# Patient Record
Sex: Female | Born: 2000 | Race: White | Hispanic: No | Marital: Single | Smoking: Current every day smoker
Health system: Southern US, Community
[De-identification: ages and names within clinical notes are randomized; demographics above are authoritative.]

## PROBLEM LIST (undated history)

## (undated) DIAGNOSIS — Z9109 Other allergy status, other than to drugs and biological substances: Secondary | ICD-10-CM

## (undated) DIAGNOSIS — K589 Irritable bowel syndrome without diarrhea: Secondary | ICD-10-CM

## (undated) DIAGNOSIS — F32A Depression, unspecified: Secondary | ICD-10-CM

## (undated) HISTORY — PX: WISDOM TOOTH EXTRACTION: SHX21

## (undated) HISTORY — DX: Other allergy status, other than to drugs and biological substances: Z91.09

## (undated) HISTORY — DX: Depression, unspecified: F32.A

## (undated) HISTORY — DX: Irritable bowel syndrome, unspecified: K58.9

---
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
## 2019-08-14 IMAGING — CT CT HEAD W/O CM
3 series · 15 of 47 positions shown, 18 images · non-contrast
Comparison: 02/24/2015

CLINICAL DATA: Syncope

EXAM:
CT HEAD WITHOUT CONTRAST
TECHNIQUE: Contiguous axial images were obtained from the base of the skull
through the vertex without intravenous contrast.

[Series 2: head trauma wo · axial · 0.43mm/px · z∈[+1560,+1685]mm · 9 of 31 slices shown, 12 images]
[im 3/31  brain]
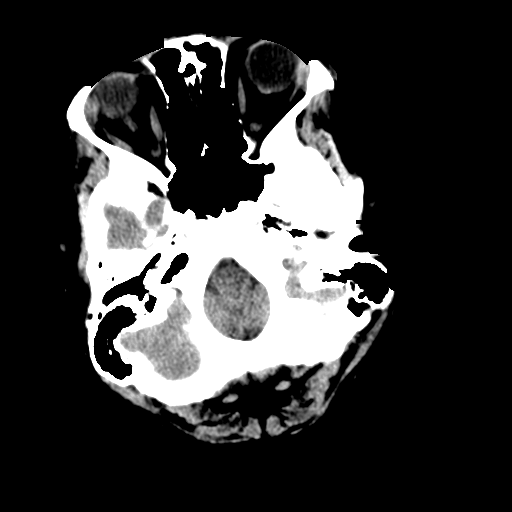
[im 3/31  bone]
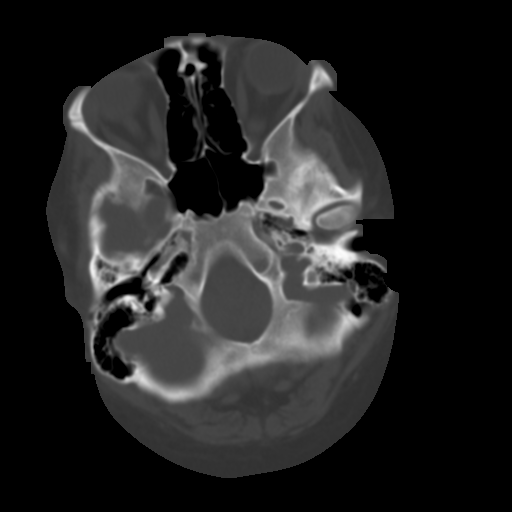
[im 6/31  brain]
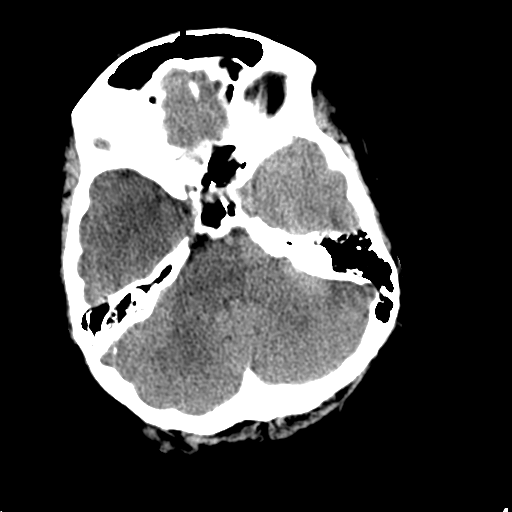
[im 9/31  brain]
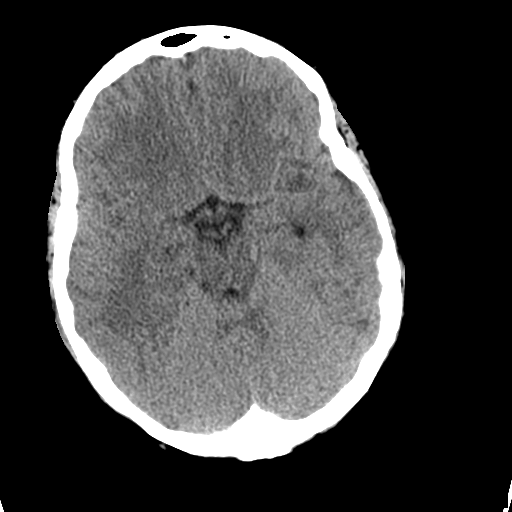
[im 12/31  brain]
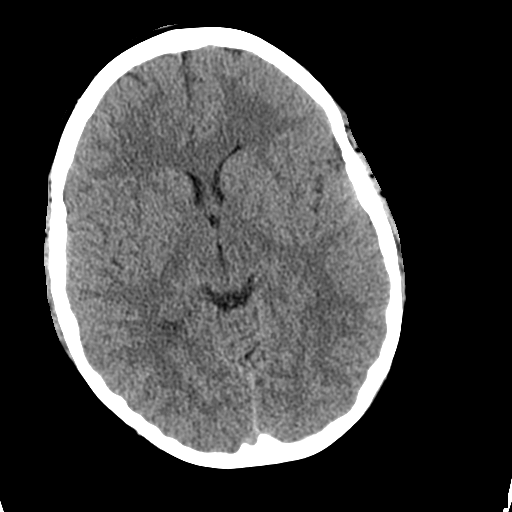
[im 16/31  brain]
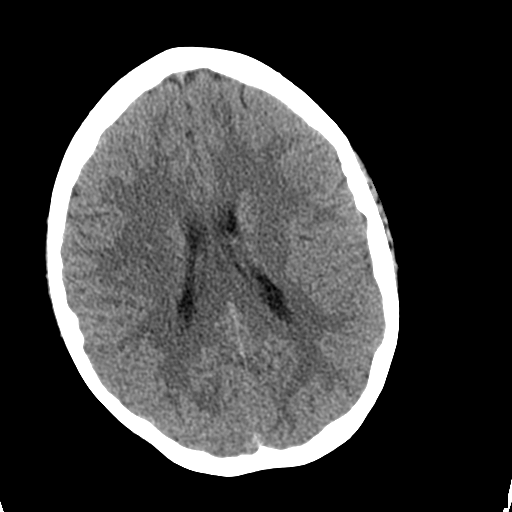
[im 16/31  bone]
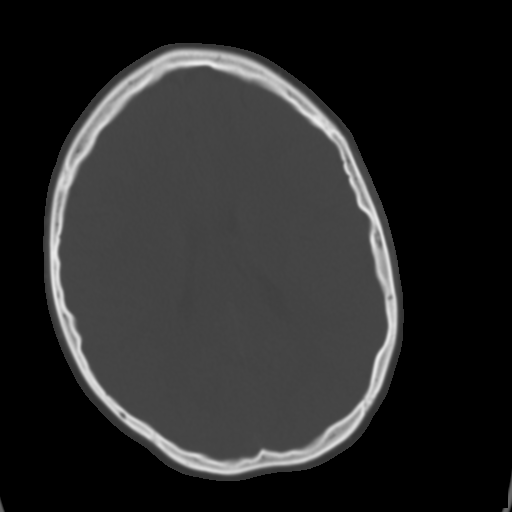
[im 19/31  brain]
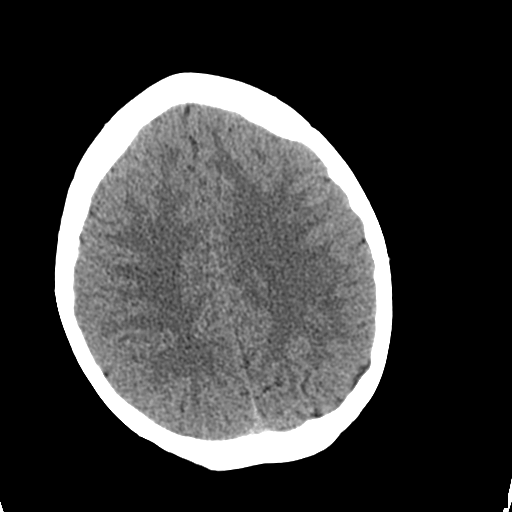
[im 22/31  brain]
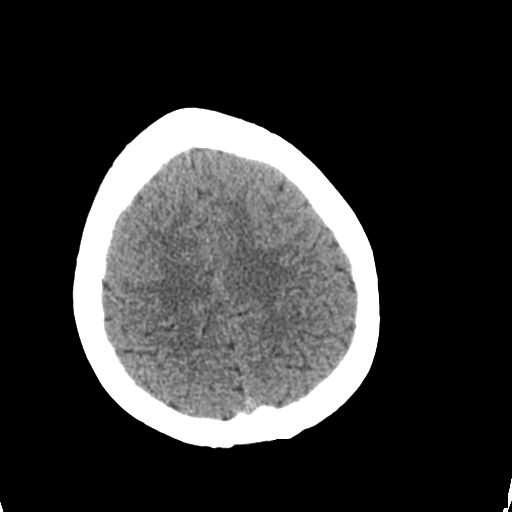
[im 25/31  brain]
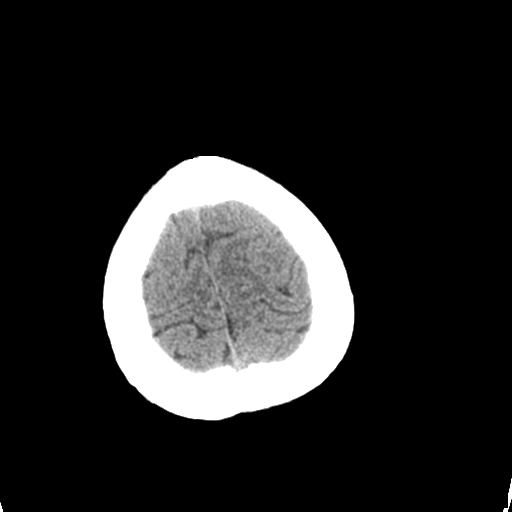
[im 28/31  brain]
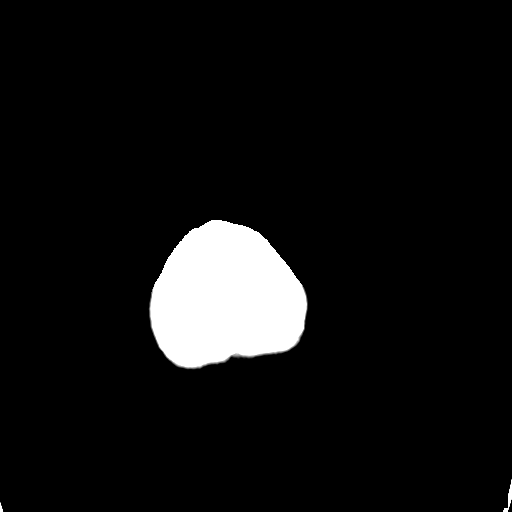
[im 28/31  bone]
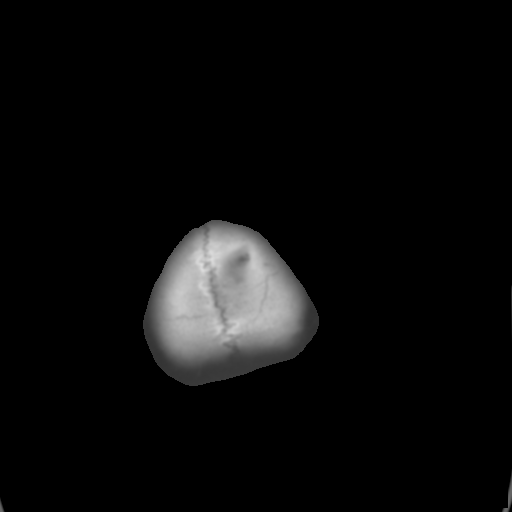

[Series 4: coronal soft tissue · coronal · 0.31mm/px · 3 of 72 slices shown]
[im 24/72  brain]
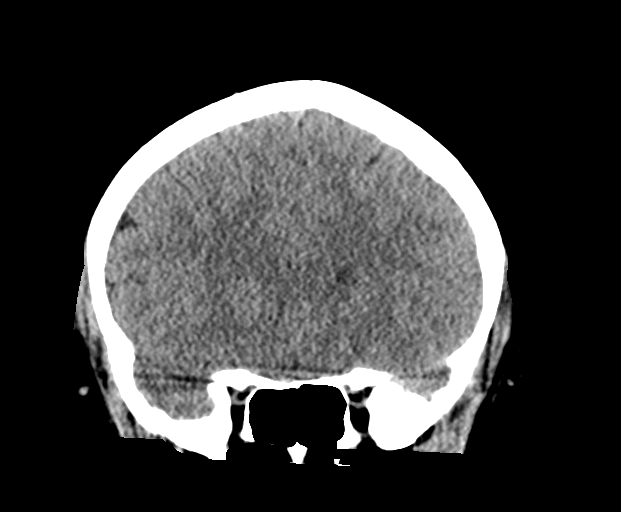
[im 32/72  brain]
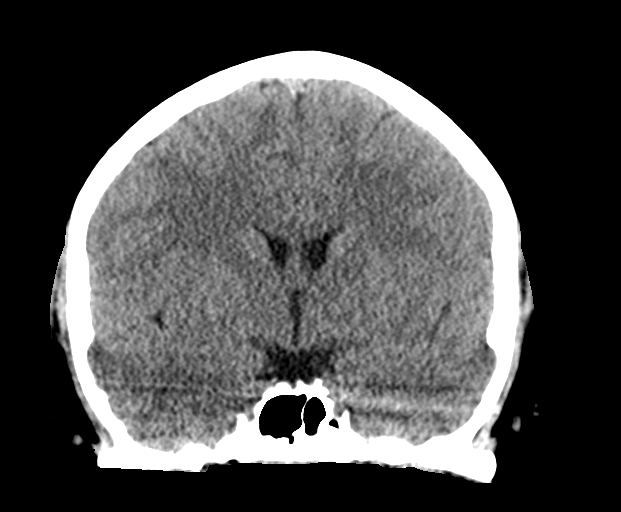
[im 40/72  brain]
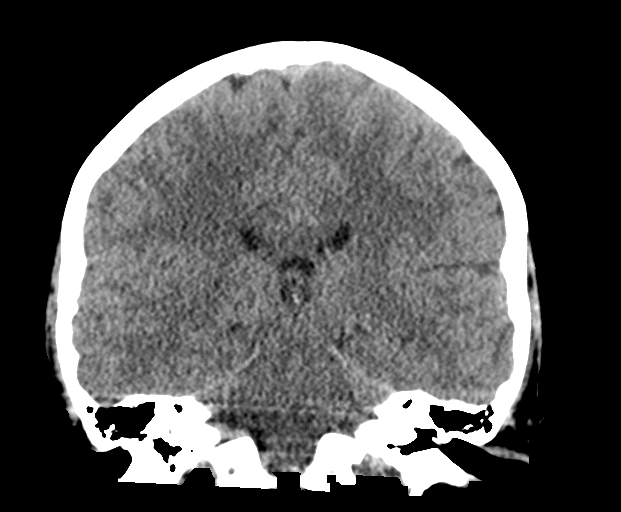

[Series 5: sagittal soft tissue · sagittal · 0.34mm/px · 3 of 58 slices shown]
[im 20/58  brain]
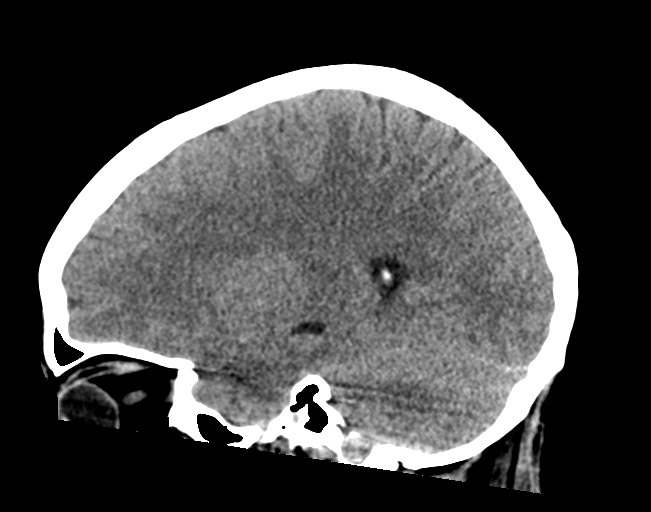
[im 29/58  brain]
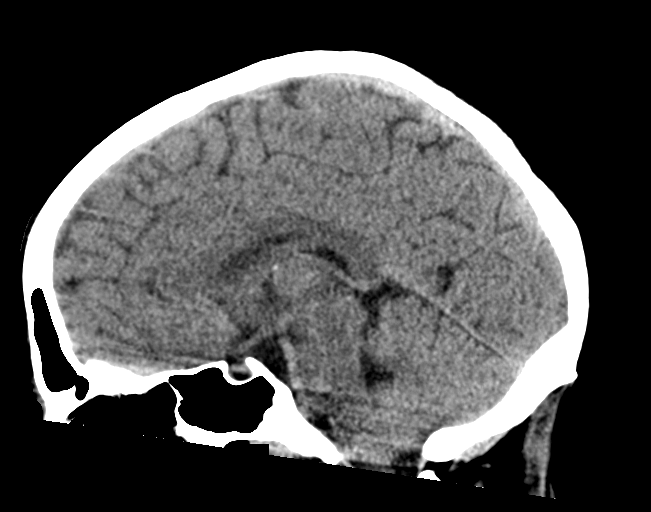
[im 39/58  brain]
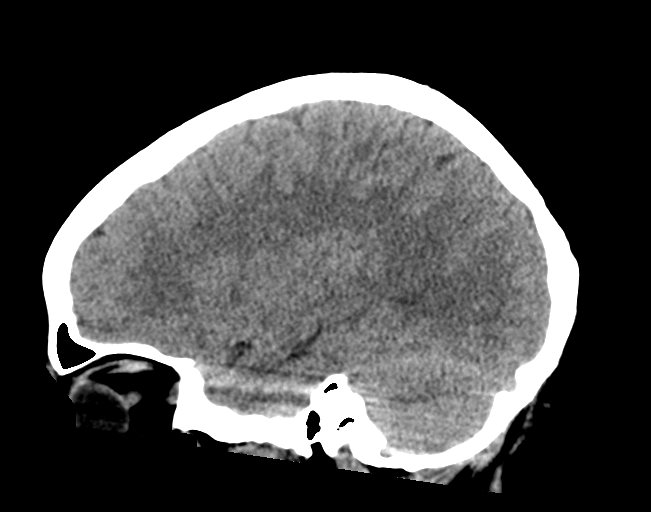

[15 of 47 positions shown; findings below may reference images not displayed]

FINDINGS: Brain: There is no mass, hemorrhage or extra-axial collection. The
size and configuration of the ventricles and extra-axial CSF spaces
are normal. There is no acute or chronic infarction. The brain
parenchyma is normal.

Vascular: No abnormal hyperdensity of the major intracranial
arteries or dural venous sinuses. No intracranial atherosclerosis.

Skull: The visualized skull base, calvarium and extracranial soft
tissues are normal.

Sinuses/Orbits: No fluid levels or advanced mucosal thickening of
the visualized paranasal sinuses. No mastoid or middle ear effusion.
The orbits are normal.
IMPRESSION: Normal head CT.

## 2021-12-08 ENCOUNTER — Other Ambulatory Visit: Payer: Self-pay | Admitting: Internal Medicine

## 2021-12-08 ENCOUNTER — Other Ambulatory Visit (HOSPITAL_COMMUNITY): Payer: Self-pay | Admitting: Internal Medicine

## 2021-12-08 DIAGNOSIS — R1011 Right upper quadrant pain: Secondary | ICD-10-CM

## 2021-12-17 ENCOUNTER — Ambulatory Visit (HOSPITAL_COMMUNITY): Payer: Medicaid Other

## 2021-12-17 ENCOUNTER — Encounter (HOSPITAL_COMMUNITY): Payer: Self-pay

## 2021-12-23 ENCOUNTER — Ambulatory Visit (HOSPITAL_COMMUNITY)
Admission: RE | Admit: 2021-12-23 | Discharge: 2021-12-23 | Disposition: A | Discharge status: Home / Self Care | Payer: Medicaid Other | Source: Ambulatory Visit | Attending: Internal Medicine | Admitting: Internal Medicine

## 2021-12-23 DIAGNOSIS — R1011 Right upper quadrant pain: Secondary | ICD-10-CM | POA: Insufficient documentation

## 2021-12-23 MED ORDER — IOHEXOL 300 MG/ML  SOLN
100.0000 mL | Freq: Once | INTRAMUSCULAR | Status: AC | PRN
  Administered 2021-12-23: 75 mL via INTRAVENOUS

## 2023-01-27 ENCOUNTER — Ambulatory Visit (INDEPENDENT_AMBULATORY_CARE_PROVIDER_SITE_OTHER): Payer: Medicaid Other | Admitting: Advanced Practice Midwife

## 2023-01-27 ENCOUNTER — Encounter: Payer: Self-pay | Admitting: Advanced Practice Midwife

## 2023-01-27 ENCOUNTER — Other Ambulatory Visit (HOSPITAL_COMMUNITY)
Admission: RE | Admit: 2023-01-27 | Discharge: 2023-01-27 | Disposition: A | Discharge status: Home / Self Care | Payer: Medicaid Other | Source: Ambulatory Visit | Attending: Advanced Practice Midwife | Admitting: Advanced Practice Midwife

## 2023-01-27 VITALS — BP 106/75 | HR 58 | Ht 66.0 in | Wt 200.0 lb

## 2023-01-27 DIAGNOSIS — Z01419 Encounter for gynecological examination (general) (routine) without abnormal findings: Secondary | ICD-10-CM | POA: Insufficient documentation

## 2023-01-27 NOTE — Addendum Note (Signed)
Addended by: Moss Mc on: 01/27/2023 12:36 PM   Modules accepted: Orders

## 2023-01-27 NOTE — Progress Notes (Signed)
WELL-WOMAN EXAMINATION Patient name: Jamie Graham MRN [id number]  Date of birth: 10/11/2000 Chief Complaint:   Annual Exam and Contraception (Desires Nexplanon)  History of Present Illness:   Jamie Graham is a 22 y.o. G0P0000 Caucasian female being seen today for a routine well-woman exam.  Current complaints: wants to start on Nexplanon per discussion with PCP at [facility] due to heavy menses; used to take OCPs but stopped awhile ago; had sex 1-1.5wks ago  PCP: [facility]      does not desire labs No LMP recorded (lmp unknown). (Menstrual status: Irregular Periods). The current method of family planning is condoms.  Last pap never. Results were: N/A. H/O abnormal pap: no Last mammogram: never. Results were: N/A. Family h/o breast cancer: no Last colonoscopy: never. Results were: N/A. Family h/o colorectal cancer: no     01/27/2023    9:12 AM  Depression screen PHQ 2/9  Decreased Interest 0  Down, Depressed, Hopeless 0  PHQ - 2 Score 0  Altered sleeping 0  Tired, decreased energy 0  Change in appetite 0  Feeling bad or failure about yourself  0  Trouble concentrating 0  Moving slowly or fidgety/restless 0  Suicidal thoughts 0  PHQ-9 Score 0        01/27/2023    9:12 AM  GAD 7 : Generalized Anxiety Score  Nervous, Anxious, on Edge 0  Control/stop worrying 0  Worry too much - different things 0  Trouble relaxing 0  Restless 0  Easily annoyed or irritable 0  Afraid - awful might happen 0  Total GAD 7 Score 0     Review of Systems:   Pertinent items are noted in HPI Denies any headaches, blurred vision, fatigue, shortness of breath, chest pain, abdominal pain, abnormal vaginal discharge/itching/odor/irritation, problems with periods, bowel movements, urination, or intercourse unless otherwise stated above. Pertinent History Reviewed:  Reviewed past medical,surgical, social and family history.  Reviewed problem list, medications and  allergies. Physical Assessment:   Vitals:   01/27/23 0857  BP: 106/75  Pulse: (!) 58  Weight: 200 lb (90.7 kg)  Height: 5\' 6"  (1.676 m)  Body mass index is 32.28 kg/m.        Physical Examination:   General appearance - well appearing, and in no distress  Mental status - alert, oriented to person, place, and time  Psych:  She has a normal mood and affect  Skin - warm and dry, normal color, no suspicious lesions noted  Chest - effort normal, all lung fields clear to auscultation bilaterally  Heart - normal rate and regular rhythm  Neck:  midline trachea, no thyromegaly or nodules  Breasts - breasts appear normal, no suspicious masses, no skin or nipple changes or  axillary nodes  Abdomen - soft, nontender, nondistended, no masses or organomegaly  Pelvic - VULVA: normal appearing vulva with no masses, tenderness or lesions  VAGINA: normal appearing vagina with normal color and discharge, no lesions  CERVIX: normal appearing cervix without discharge or lesions, no CMT  Thin prep pap is done without HR HPV cotesting  UTERUS: uterus is felt to be normal size, shape, consistency and nontender   ADNEXA: No adnexal masses or tenderness noted.  Rectal - not examined    Chaperone: Latisha Cresenzo    No results found for this or any previous visit (from the past 24 hour(s)).  Assessment & Plan:  1) Well-Woman Exam  2) Wants Nexplanon> understands R&Bs including  risk of irreg bldg; will sched for next week- abstinence until then  3) Major depression/ADD> Trazodone & Vraylar  Labs/procedures today: Pap  Mammogram: @ 23yo, or sooner if problems Colonoscopy: @ 23yo, or sooner if problems  No orders of the defined types were placed in this encounter.   Meds: No orders of the defined types were placed in this encounter.   Follow-up: Return for Nex insertion next Wed or Thurs.  Arabella Merles CNM 01/27/2023 9:32 AM

## 2023-02-01 LAB — CYTOLOGY - PAP
Chlamydia: NEGATIVE
Comment: NEGATIVE
Comment: NEGATIVE
Comment: NORMAL
Diagnosis: UNDETERMINED — AB
High risk HPV: NEGATIVE
Neisseria Gonorrhea: NEGATIVE

## 2023-02-03 ENCOUNTER — Ambulatory Visit: Payer: Medicaid Other | Admitting: Advanced Practice Midwife

## 2023-02-03 ENCOUNTER — Encounter: Payer: Self-pay | Admitting: Advanced Practice Midwife

## 2023-02-03 VITALS — BP 101/66 | HR 69 | Ht 67.0 in | Wt 201.0 lb

## 2023-02-03 DIAGNOSIS — Z30017 Encounter for initial prescription of implantable subdermal contraceptive: Secondary | ICD-10-CM | POA: Insufficient documentation

## 2023-02-03 DIAGNOSIS — Z3202 Encounter for pregnancy test, result negative: Secondary | ICD-10-CM | POA: Diagnosis not present

## 2023-02-03 LAB — POCT URINE PREGNANCY: Preg Test, Ur: NEGATIVE

## 2023-02-03 MED ORDER — ETONOGESTREL 68 MG ~~LOC~~ IMPL
68.0000 mg | DRUG_IMPLANT | Freq: Once | SUBCUTANEOUS | Status: AC
Start: 2023-02-03 — End: 2023-02-03
  Administered 2023-02-03: 68 mg via SUBCUTANEOUS

## 2023-02-03 NOTE — Progress Notes (Addendum)
NEXPLANON INSERTION Patient name: Jamie Graham MRN [id number]  Date of birth: 2000-08-08 Subjective Findings:   Jamie Graham is a 22 y.o. G0P0000 Caucasian female being seen today for insertion of a Nexplanon.  No LMP recorded (lmp unknown). (Menstrual status: Irregular Periods). Last sexual intercourse was >2wks ago Last pap 01/27/23. Results were: ASCUS w/ HRHPV negative  Risks/benefits/side effects of Nexplanon have been discussed and her questions have been answered.  Specifically, a failure rate of 05/998 has been reported, with an increased failure rate if pt takes St. John's Wort and/or antiseizure medicaitons.  She is aware of the common side effect of irregular bleeding, which the incidence of decreases over time. Signed copy of informed consent in chart.      01/27/2023    9:12 AM  Depression screen PHQ 2/9  Decreased Interest 0  Down, Depressed, Hopeless 0  PHQ - 2 Score 0  Altered sleeping 0  Tired, decreased energy 0  Change in appetite 0  Feeling bad or failure about yourself  0  Trouble concentrating 0  Moving slowly or fidgety/restless 0  Suicidal thoughts 0  PHQ-9 Score 0        01/27/2023    9:12 AM  GAD 7 : Generalized Anxiety Score  Nervous, Anxious, on Edge 0  Control/stop worrying 0  Worry too much - different things 0  Trouble relaxing 0  Restless 0  Easily annoyed or irritable 0  Afraid - awful might happen 0  Total GAD 7 Score 0     Pertinent History Reviewed:   Reviewed past medical,surgical, social, obstetrical and family history.  Reviewed problem list, medications and allergies. Objective Findings & Procedure:    Vitals:   02/03/23 1140  BP: 101/66  Pulse: 69  Weight: 201 lb (91.2 kg)  Height: 5\' 7"  (1.702 m)  Body mass index is 31.48 kg/m.  Results for orders placed or performed in visit on 02/03/23 (from the past 24 hour(s))  POCT urine pregnancy   Collection Time: 02/03/23 11:45 AM  Result Value Ref Range   Preg  Test, Ur Negative Negative     Time out was performed.  She is right-handed, so her left arm, approximately 10cm from the medial epicondyle and 3-5cm posterior to the sulcus, was cleansed with alcohol and anesthetized with 2cc of 2% Lidocaine.  The area was cleansed again with betadine and the Nexplanon was inserted per manufacturer's recommendations without difficulty.  3 steri-strips and pressure bandage were applied. The patient tolerated the procedure well.  Assessment & Plan:   1) Nexplanon insertion Pt was instructed to keep the area clean and dry, remove pressure bandage in 24 hours, and keep insertion site covered with the steri-strip for 3-5 days.  Back up contraception was recommended for 2 weeks.  She was given a card indicating date Nexplanon was inserted and date it needs to be removed. Follow-up PRN problems.  EXP 2026-04 LOT [id number]  Orders Placed This Encounter  Procedures   POCT urine pregnancy    Follow-up: Return for prn.  Arabella Merles CNM 02/03/2023 12:33 PM

## 2023-06-09 ENCOUNTER — Encounter: Payer: Self-pay | Admitting: Advanced Practice Midwife

## 2023-06-09 ENCOUNTER — Ambulatory Visit: Payer: 59 | Admitting: Advanced Practice Midwife

## 2023-06-09 VITALS — BP 113/63 | HR 54 | Ht 67.0 in | Wt 192.4 lb

## 2023-06-09 DIAGNOSIS — N921 Excessive and frequent menstruation with irregular cycle: Secondary | ICD-10-CM | POA: Diagnosis not present

## 2023-06-09 DIAGNOSIS — Z975 Presence of (intrauterine) contraceptive device: Secondary | ICD-10-CM | POA: Insufficient documentation

## 2023-06-09 MED ORDER — MEGESTROL ACETATE 40 MG PO TABS: ORAL_TABLET | ORAL | 3 refills | Status: DC

## 2023-06-09 NOTE — Progress Notes (Signed)
   GYN VISIT Patient name: Jamie Graham MRN [id number]  Date of birth: 2001-02-08 Chief Complaint:   Menstrual Problem (Period started 05-07-23,just stop 06-07-23. Has nexplanon)  History of Present Illness:   Jamie Graham is a 23 y.o. G0P0000 Caucasian female being seen today for just finishing a 30d period; used one box of tampons this past month; had Nexplanon placed in September and it otherwise has been going well.     Patient's last menstrual period was 05/07/2023. The current method of family planning is Nexplanon.  Last pap Sept 2024. Results were: NILM w/ HRHPV negative     01/27/2023    9:12 AM  Depression screen PHQ 2/9  Decreased Interest 0  Down, Depressed, Hopeless 0  PHQ - 2 Score 0  Altered sleeping 0  Tired, decreased energy 0  Change in appetite 0  Feeling bad or failure about yourself  0  Trouble concentrating 0  Moving slowly or fidgety/restless 0  Suicidal thoughts 0  PHQ-9 Score 0        01/27/2023    9:12 AM  GAD 7 : Generalized Anxiety Score  Nervous, Anxious, on Edge 0  Control/stop worrying 0  Worry too much - different things 0  Trouble relaxing 0  Restless 0  Easily annoyed or irritable 0  Afraid - awful might happen 0  Total GAD 7 Score 0     Review of Systems:   Pertinent items are noted in HPI Denies fever/chills, dizziness, headaches, visual disturbances, fatigue, shortness of breath, chest pain, abdominal pain, vomiting, abnormal vaginal discharge/itching/odor/irritation, problems with periods, bowel movements, urination, or intercourse unless otherwise stated above.  Pertinent History Reviewed:  Reviewed past medical,surgical, social, obstetrical and family history.  Reviewed problem list, medications and allergies. Physical Assessment:   Vitals:   06/09/23 1342  BP: 113/63  Pulse: (!) 54  Weight: 192 lb 6.4 oz (87.3 kg)  Height: 5\' 7"  (1.702 m)  Body mass index is 30.13 kg/m.       Physical Examination:   General  appearance: alert, well appearing, and in no distress  Mental status: alert, oriented to person, place, and time  Skin: warm & dry   Cardiovascular: normal heart rate noted  Respiratory: normal respiratory effort, no distress  Abdomen: soft, non-tender   Pelvic: examination not indicated  Extremities: no edema    No results found for this or any previous visit (from the past 24 hours).  Assessment & Plan:  1) Irreg bldg w Nex> interested in giving Megace a try; rx #45 w 3RF (3 x 5d, 2x 5d, 1 every day til bldg stops)   Meds:  Meds ordered this encounter  Medications   megestrol (MEGACE) 40 MG tablet    Sig: Take 3 tabs/day x 5d, then 2 tabs/day x 5d, then 1 tab daily until bleeding stops    Dispense:  45 tablet    Refill:  3    Supervising Provider:   Myna Hidalgo [[id number]]    No orders of the defined types were placed in this encounter.   Return for prn.  Arabella Merles CNM 06/09/2023 2:03 PM

## 2023-06-15 ENCOUNTER — Encounter: Payer: Self-pay | Admitting: Internal Medicine

## 2023-06-15 ENCOUNTER — Telehealth: Payer: Self-pay | Admitting: Internal Medicine

## 2023-06-15 ENCOUNTER — Other Ambulatory Visit: Payer: 59

## 2023-06-15 ENCOUNTER — Ambulatory Visit: Payer: 59 | Attending: Internal Medicine | Admitting: Internal Medicine

## 2023-06-15 VITALS — BP 110/62 | HR 83 | Ht 67.0 in | Wt 207.8 lb

## 2023-06-15 DIAGNOSIS — Z136 Encounter for screening for cardiovascular disorders: Secondary | ICD-10-CM | POA: Diagnosis not present

## 2023-06-15 DIAGNOSIS — R55 Syncope and collapse: Secondary | ICD-10-CM | POA: Diagnosis not present

## 2023-06-15 DIAGNOSIS — I951 Orthostatic hypotension: Secondary | ICD-10-CM | POA: Insufficient documentation

## 2023-06-15 NOTE — Telephone Encounter (Signed)
Checking percert on the following patient for testing scheduled at [facility].    ETT- 07/08/23   ECHO   07/01/23  [facility]

## 2023-06-15 NOTE — Patient Instructions (Addendum)
Medication Instructions:  Your physician recommends that you continue on your current medications as directed. Please refer to the Current Medication list given to you today.   Labwork: None  Testing/Procedures: Your physician has requested that you have an echocardiogram. Echocardiography is a painless test that uses sound waves to create images of your heart. It provides your doctor with information about the size and shape of your heart and how well your heart's chambers and valves are working. This procedure takes approximately one hour. There are no restrictions for this procedure. Please do NOT wear cologne, perfume, aftershave, or lotions (deodorant is allowed). Please arrive 15 minutes prior to your appointment time.  Please note: We ask at that you not bring children with you during ultrasound (echo/ vascular) testing. Due to room size and safety concerns, children are not allowed in the ultrasound rooms during exams. Our front office staff cannot provide observation of children in our lobby area while testing is being conducted. An adult accompanying a patient to their appointment will only be allowed in the ultrasound room at the discretion of the ultrasound technician under special circumstances. We apologize for any inconvenience.  Your physician has requested that you have an echocardiogram. Echocardiography is a painless test that uses sound waves to create images of your heart. It provides your doctor with information about the size and shape of your heart and how well your heart's chambers and valves are working. This procedure takes approximately one hour. There are no restrictions for this procedure. Please do NOT wear cologne, perfume, aftershave, or lotions (deodorant is allowed). Please arrive 15 minutes prior to your appointment time.  Please note: We ask at that you not bring children with you during ultrasound (echo/ vascular) testing. Due to room size and safety concerns,  children are not allowed in the ultrasound rooms during exams. Our front office staff cannot provide observation of children in our lobby area while testing is being conducted. An adult accompanying a patient to their appointment will only be allowed in the ultrasound room at the discretion of the ultrasound technician under special circumstances. We apologize for any inconvenience.  Your physician has recommended that you wear a Zio monitor.   This monitor is a medical device that records the heart's electrical activity. Doctors most often use these monitors to diagnose arrhythmias. Arrhythmias are problems with the speed or rhythm of the heartbeat. The monitor is a small device applied to your chest. You can wear one while you do your normal daily activities. While wearing this monitor if you have any symptoms to push the button and record what you felt. Once you have worn this monitor for the period of time provider prescribed (for 14 days), you will return the monitor device in the postage paid box. Once it is returned they will download the data collected and provide [place] with a report which the provider will then review and we will call you with those results. Important tips:  Avoid showering during the first 24 hours of wearing the monitor. Avoid excessive sweating to help maximize wear time. Do not submerge the device, no hot tubs, and no swimming pools. Keep any lotions or oils away from the patch. After 24 hours you may shower with the patch on. Take brief showers with your back facing the shower head.  Do not remove patch once it has been placed because that will interrupt data and decrease adhesive wear time. Push the button when you have any symptoms and write  down what you were feeling. Once you have completed wearing your monitor, remove and place into box which has postage paid and place in your outgoing mailbox.  If for some reason you have misplaced your box then call our office and we  can provide another box and/or mail it off for you.   Follow-Up: Your physician recommends that you schedule a follow-up appointment in: Pending Results  Any Other Special Instructions Will Be Listed Below (If Applicable).  If you need a refill on your cardiac medications before your next appointment, please call your pharmacy.

## 2023-06-15 NOTE — Progress Notes (Signed)
Cardiology Office Note  Date: 06/15/2023   ID: Jamie Graham, DOB [birth year], MRN [id number]  PCP:  Pcp, No  Cardiologist:  None Electrophysiologist:  None   History of Present Illness: Jamie Graham is a 23 y.o. female known to have depression/anxiety was referred to cardiology clinic for evaluation of syncope.  Patient had ongoing symptoms of syncopal episodes preceded by prodrome of dizziness, palpitations and tunnel vision etc.  Sometimes these occur few times per day but most happen every day almost.  Occurring more frequently recently.  Ongoing for quite a while.  No seizure-like activity during the syncopal episode.  No postictal confusion.  Witnessed by her family member.  No chest pains.  No palpitations.  Orthostatic vitals performed today in the clinic were positive for delayed orthostatic hypotension and not POTS.  EKG today showed NSR, no ischemia, normal QTc interval and no evidence of preexcitation.  Past Medical History:  Diagnosis Date   Depression    Environmental allergies    IBS (irritable bowel syndrome)     Past Surgical History:  Procedure Laterality Date   WISDOM TOOTH EXTRACTION      Current Outpatient Medications  Medication Sig Dispense Refill   Etonogestrel (NEXPLANON [place]) Inject into the skin as directed.     FLUoxetine (PROZAC) 40 MG capsule Take 40 mg by mouth daily.     megestrol (MEGACE) 40 MG tablet Take 3 tabs/day x 5d, then 2 tabs/day x 5d, then 1 tab daily until bleeding stops 45 tablet 3   omeprazole (PRILOSEC) 40 MG capsule Take 40 mg by mouth every morning.     traZODone (DESYREL) 100 MG tablet Take 100 mg by mouth daily.     VRAYLAR 1.5 MG capsule Take 1.5 mg by mouth daily.     No current facility-administered medications for this visit.   Allergies:  Bactrim [sulfamethoxazole-trimethoprim]   Social History: The patient  reports that she has never smoked. She has never used smokeless tobacco. She reports that she does not  drink alcohol and does not use drugs.   Family History: The patient's family history includes ADD / ADHD in her brother and mother; Anxiety disorder in her maternal grandmother and mother; Autism in her brother; Breast cancer in her paternal grandmother; COPD in her maternal grandmother; Cancer in her father; Depression in her maternal aunt, maternal aunt, maternal grandmother, mother, and sister; Diabetes in her maternal grandmother; Fibromyalgia in her mother; Heart attack in her paternal grandfather; Hypertension in her father and maternal grandfather; Migraines in her mother.   ROS:  Please see the history of present illness. Otherwise, complete review of systems is positive for none  All other systems are reviewed and negative.   Physical Exam: VS:  BP 110/62   Pulse 83   Ht 5\' 7"  (1.702 m)   Wt 207 lb 12.8 oz (94.3 kg)   LMP 05/07/2023   SpO2 99%   BMI 32.55 kg/m , BMI Body mass index is 32.55 kg/m.  Wt Readings from Last 3 Encounters:  06/15/23 207 lb 12.8 oz (94.3 kg)  06/09/23 192 lb 6.4 oz (87.3 kg)  02/03/23 201 lb (91.2 kg)    General: Patient appears comfortable at rest. HEENT: Conjunctiva and lids normal, oropharynx clear with moist mucosa. Neck: Supple, no elevated JVP or carotid bruits, no thyromegaly. Lungs: Clear to auscultation, nonlabored breathing at rest. Cardiac: Regular rate and rhythm, no S3 or significant systolic murmur, no pericardial rub. Abdomen: Soft, nontender, no hepatomegaly,  bowel sounds present, no guarding or rebound. Extremities: No pitting edema, distal pulses 2+. Skin: Warm and dry. Musculoskeletal: No kyphosis. Neuropsychiatric: Alert and oriented x3, affect grossly appropriate.  Recent Labwork: No results found for requested labs within last 365 days.  No results found for: "CHOL", "TRIG", "HDL", "CHOLHDL", "VLDL", "LDLCALC", "LDLDIRECT"    Assessment and Plan:  Syncope, unclear etiology Delayed orthostatic  hypotension Anxiety   -Ongoing symptoms of syncope preceded by a prodrome of dizziness, palpitations, tunnel vision etc.  No seizure-like activity during the syncopal episode.  No postictal confusion.  No triggers for vasovagal syncope identified.  Orthostatic vitals today were positive for delayed orthostatic hypotension and not for POTS.  Strongly encouraged p.o. hydration, liberal salt intake between 3 to 5 g daily, avoid heat exposure, eat small frequent meals, use compression stockings etc.  Will obtain 2-week event monitor, live to rule out any conduction system abnormalities, obtain echocardiogram to rule out any structural heart disease, obtain exercise tolerance test to rule out any exercise-induced arrhythmias.  Treat underlying anxiety, follow-up with PCP.       Medication Adjustments/Labs and Tests Ordered: Current medicines are reviewed at length with the patient today.  Concerns regarding medicines are outlined above.    Disposition:  Follow up  pending results  Signed Emmamarie Kluender Verne Spurr, MD, 06/15/2023 4:34 PM    [facility] [address] [place], [place], [place] [address]

## 2023-06-16 ENCOUNTER — Other Ambulatory Visit: Payer: Self-pay | Admitting: Internal Medicine

## 2023-06-16 DIAGNOSIS — R55 Syncope and collapse: Secondary | ICD-10-CM

## 2023-07-01 ENCOUNTER — Ambulatory Visit: Payer: 59 | Attending: Internal Medicine

## 2023-07-01 DIAGNOSIS — R55 Syncope and collapse: Secondary | ICD-10-CM

## 2023-07-02 LAB — ECHOCARDIOGRAM COMPLETE
AR max vel: 4.27 cm2
AV Area VTI: 3.63 cm2
AV Area mean vel: 3.9 cm2
AV Mean grad: 4 mm[Hg]
AV Peak grad: 8.2 mm[Hg]
Ao pk vel: 1.43 m/s
Area-P 1/2: 4.8 cm2
Calc EF: 63.8 %
MV VTI: 2.75 cm2
S' Lateral: 3.6 cm
Single Plane A2C EF: 66.3 %
Single Plane A4C EF: 57 %

## 2023-07-08 ENCOUNTER — Ambulatory Visit (HOSPITAL_COMMUNITY): Payer: 59

## 2023-07-08 ENCOUNTER — Encounter (HOSPITAL_COMMUNITY): Payer: 59

## 2023-07-14 ENCOUNTER — Telehealth (HOSPITAL_COMMUNITY): Payer: Self-pay | Admitting: Surgery

## 2023-07-14 NOTE — Telephone Encounter (Signed)
 I called the pt to remind her of her stress test tomorrow, with her arrival time being 9:00.  I also told the pt to check in at the front desk to get registered.  I went over the following instructions:  nothing to eat or drink 6 hours prior to the test (no caffeine or chocolate), no smoking 8 hrs prior to the test, wear comfortable clothes and tennis shoes, and do not wear any lotions/oils.  The pt verbalized understanding of these instructions.

## 2023-07-15 ENCOUNTER — Ambulatory Visit (HOSPITAL_COMMUNITY)
Admission: RE | Admit: 2023-07-15 | Discharge: 2023-07-15 | Disposition: A | Discharge status: Home / Self Care | Payer: 59 | Source: Ambulatory Visit | Attending: Internal Medicine | Admitting: Internal Medicine

## 2023-07-15 DIAGNOSIS — R55 Syncope and collapse: Secondary | ICD-10-CM | POA: Diagnosis present

## 2023-07-15 LAB — EXERCISE TOLERANCE TEST
Angina Index: 0
Duke Treadmill Score: 8
Estimated workload: 10.4
Exercise duration (min): 8 min
Exercise duration (sec): 1 s
MPHR: 197 {beats}/min
Peak HR: 176 {beats}/min
Percent HR: 89 %
RPE: 16
Rest HR: 64 {beats}/min
ST Depression (mm): 0 mm

## 2023-12-09 ENCOUNTER — Ambulatory Visit (HOSPITAL_COMMUNITY): Admitting: Registered Nurse

## 2023-12-09 ENCOUNTER — Encounter (HOSPITAL_COMMUNITY): Payer: Self-pay | Admitting: Registered Nurse

## 2023-12-09 DIAGNOSIS — F515 Nightmare disorder: Secondary | ICD-10-CM | POA: Diagnosis not present

## 2023-12-09 DIAGNOSIS — Z79899 Other long term (current) drug therapy: Secondary | ICD-10-CM

## 2023-12-09 DIAGNOSIS — F333 Major depressive disorder, recurrent, severe with psychotic symptoms: Secondary | ICD-10-CM | POA: Diagnosis not present

## 2023-12-09 DIAGNOSIS — F411 Generalized anxiety disorder: Secondary | ICD-10-CM | POA: Diagnosis not present

## 2023-12-09 MED ORDER — ARIPIPRAZOLE 2 MG PO TABS: 2.0000 mg | ORAL_TABLET | Freq: Every day | ORAL | 0 refills | Status: DC

## 2023-12-09 MED ORDER — PRAZOSIN HCL 1 MG PO CAPS: 1.0000 mg | ORAL_CAPSULE | Freq: Every day | ORAL | 0 refills | Status: DC

## 2023-12-09 MED ORDER — FLUOXETINE HCL 60 MG PO TABS
60.0000 mg | ORAL_TABLET | Freq: Every day | ORAL | 0 refills | Status: DC
Start: 2023-12-09 — End: 2023-12-23

## 2023-12-09 NOTE — Progress Notes (Signed)
 Psychiatric Initial Adult Assessment   Patient Identification: Jamie Graham MRN:  [id number] Date of Evaluation:  12/09/2023  Virtual Visit via Video Note  I connected with Jamie Graham on 12/09/23 at 10:00 AM EDT by a video enabled telemedicine application and verified that I am speaking with the correct person using two identifiers.  Location: Patient: Home Provider: Home office   I discussed the limitations of evaluation and management by telemedicine and the availability of in person appointments. The patient expressed understanding and agreed to proceed.  I discussed the assessment and treatment plan with the patient. The patient was provided an opportunity to ask questions and all were answered. The patient agreed with the plan and demonstrated an understanding of the instructions.   The patient was advised to call back or seek an in-person evaluation if the symptoms worsen or if the condition fails to improve as anticipated.  I provided 60 minutes of non-face-to-face time during this encounter.   Luisa Ruder, NP   Referral Source: Leavy Waddell NOVAK, FNP  Chief Complaint:  No chief complaint on file.  Visit Diagnosis:    ICD-10-CM   1. GAD (generalized anxiety disorder)  F41.1 FLUoxetine  HCl 60 MG TABS    2. Severe episode of recurrent major depressive disorder, with psychotic features (HCC)  F33.3 ARIPiprazole  (ABILIFY ) 2 MG tablet    FLUoxetine  HCl 60 MG TABS    3. Nightmares  F51.5 prazosin  (MINIPRESS ) 1 MG capsule    4. On psychotropic medication  Z79.899 TSH    Prolactin    Magnesium    HgB A1c    Lipid panel    hCG, serum, qualitative    CBC with Differential    Comprehensive metabolic panel with GFR      History of Present Illness:  Jamie Graham 23 y.o. female presents today to establish care for medication management.  She is seen virtual video visit by this provider, and chart reviewed on 12/09/23.  Her psychiatric history is significant  for major depression, general anxiety, bipolar disorder, nightmares, and sleep disturbance.  Her mental health is not currently managed with medication. But states she is prescribed Prozac  40 mg for GI issues but unable to tell what the GI issues are other than gastrointestinal issues. She reports she has taken psychotropic medications in the past.  None since 07/2023. During assessment screening she denies depressive symptoms but later in the assessment she admits to depressed mood, anhedonia, low energy, fatigue and passive suicidal thoughts.  Also reporting worsening of anxiety, panic attacks, excessive worry.  She states it has been 3 weeks since she has last had passive suicidal thoughts without a plan or intent.  States she would never attempt to kill herself.  She denies having a gun in home or access to a gun.  Could just go to sleep and not wake up. States she is scared to attempt to kill herself because Scared of how it would be, who would find me, and how things will go after I'm gone.   She states she is unaware of any stressors other than medical illness I was just diagnosed with POTS, and chronic GI problems; and always being worried about her surrounding becomes tiring.  She reports hypervigilance I'm always expecting something to happen so I'm always looking around and aware of everything around me.  Reports this has been going on for 5-6 yrs.  She states that she is also having nightmares about every night.  Reports difficulty falling  and staying asleep and feeling tired the next morning. She endorses auditory hallucinations of hearing someone call her name when no one is there, and paranoid of her surroundings always thinking something is going to happen.   Today she denies suicidal/self-harm/homicidal ideation and abnormal movements.  PHQ 2/9, C-SSRS, GAD 7, AUDIT, AIMS, nutrition, and pain screenings conducted during today's visit, see scores below.   Treatment options discussed:   Doesn't want to restart Vraylar related it didn't work but there was never a dose increase while she was taking it 1.5 mg daily.  Prozac  is also helpful for depression and anxiety and agrees to increase of dosage.  Agrees to trial of Abilify  for mood, and depression and Prazosin  for nightmares.  If sleep doesn't improve by next visit will address medication to help with sleep.  She is also suppose to be wearing CPAP but finds it uncomfortable.  Was uncomfortable prior to nasal piercing and now would be worse.      Recommended the following:  Increase Prozac  60 mg daily.  Start Abilify  2 mg daily and Prazosin  1 mg Q hs.  Referral to counseling/therapy.  Information on different forms of psychotherapy offered.  She is Informed of side effect/efficacy profile on Abilify  and Prazosin  along with educational material attached to AVS.  She is also informed that usually takes a couple of weeks before notable improvements are seen.  She voices understanding/agreement with information/recommendations being given to her today.    Associated Signs/Symptoms: Depression Symptoms:  depressed mood, anhedonia, suicidal thoughts without plan, anxiety, panic attacks, loss of energy/fatigue, disturbed sleep, (Hypo) Manic Symptoms:  Distractibility, Hallucinations, Irritable Mood, Labiality of Mood, Anxiety Symptoms:  Excessive Worry, Panic Symptoms, Social Anxiety, Psychotic Symptoms:  Hallucinations: Auditory PTSD Symptoms: Had a traumatic exposure:  Reports a traumatic history during childhood Hypervigilance:  Yes nightmares  Past Psychiatric History:  Diagnosis: major depression, ADHD, anxiety, bipolar disorder Suicide attempt:Denies Non-suicidal self-injurious behavior:  Reports cutting during teenage years.  Last cut at 23 y/o Psychiatric hospitalization:  Denies Past trauma: Reports a traumatic childhood history But, that's all I'm going to say about that and no further elaboration.    Substance abuse:  Denies  Previous Psychotropic Medications: Yes, Adderall, Amitriptyline , Clonidine, Prozac , Vraylar, Trazodone (caused worsening of nightmares), Abilify   Substance Abuse History in the last 12 months:  No.  Consequences of Substance Abuse: NA  Past Medical History:  Past Medical History:  Diagnosis Date   Depression    Environmental allergies    IBS (irritable bowel syndrome)     Past Surgical History:  Procedure Laterality Date   WISDOM TOOTH EXTRACTION      Family Psychiatric History:    Family History:  Family History  Problem Relation Age of Onset   Heart attack Paternal Grandfather    Breast cancer Paternal Grandmother    Diabetes Maternal Grandmother    Anxiety disorder Maternal Grandmother    Depression Maternal Grandmother    COPD Maternal Grandmother    Hypertension Maternal Grandfather    Hypertension Father    Cancer Father        skin   Depression Mother    Anxiety disorder Mother    ADD / ADHD Mother    Fibromyalgia Mother    Migraines Mother    ADD / ADHD Brother    Autism Brother    Depression Sister    Depression Maternal Aunt    Depression Maternal Aunt     Social History:   Social History  Socioeconomic History   Marital status: Single    Spouse name: Not on file   Number of children: Not on file   Years of education: Not on file   Highest education level: Not on file  Occupational History   Not on file  Tobacco Use   Smoking status: Never   Smokeless tobacco: Never  Vaping Use   Vaping status: Every Day  Substance and Sexual Activity   Alcohol use: No   Drug use: No   Sexual activity: Yes    Birth control/protection: Condom  Other Topics Concern   Not on file  Social History Narrative   Not on file   Social Drivers of Health   Financial Resource Strain: Low Risk  (01/27/2023)   Overall Financial Resource Strain (CARDIA)    Difficulty of Paying Living Expenses: Not hard at all  Food Insecurity: No  Food Insecurity (01/27/2023)   Hunger Vital Sign    Worried About Running Out of Food in the Last Year: Never true    Ran Out of Food in the Last Year: Never true  Transportation Needs: No Transportation Needs (01/27/2023)   PRAPARE - Administrator, Civil Service (Medical): No    Lack of Transportation (Non-Medical): No  Physical Activity: Sufficiently Active (01/27/2023)   Exercise Vital Sign    Days of Exercise per Week: 5 days    Minutes of Exercise per Session: 100 min  Stress: No Stress Concern Present (01/27/2023)   Jamie Graham    Feeling of Stress : Not at all  Social Connections: Socially Isolated (01/27/2023)   Social Connection and Isolation Panel    Frequency of Communication with Friends and Family: Twice a week    Frequency of Social Gatherings with Friends and Family: Three times a week    Attends Religious Services: Never    Active Member of Clubs or Organizations: No    Attends Banker Meetings: Never    Marital Status: Never married    Allergies:   Allergies  Allergen Reactions   Bactrim [Sulfamethoxazole-Trimethoprim] Rash    Metabolic Disorder Labs:No recent labs.   Lab Orders         TSH         Prolactin         Magnesium         HgB A1c         Lipid panel         hCG, serum, qualitative         CBC with Differential         Comprehensive metabolic panel with GFR      Current Medications: Current Outpatient Medications  Medication Sig Dispense Refill   ARIPiprazole  (ABILIFY ) 2 MG tablet Take 1 tablet (2 mg total) by mouth daily. 30 tablet 0   FLUoxetine  HCl 60 MG TABS Take 60 mg by mouth daily at 12 noon. 30 tablet 0   prazosin  (MINIPRESS ) 1 MG capsule Take 1 capsule (1 mg total) by mouth at bedtime. 30 capsule 0   Etonogestrel  (NEXPLANON  [place]) Inject into the skin as directed.     megestrol  (MEGACE ) 40 MG tablet Take 3 tabs/day x 5d, then 2 tabs/day x 5d, then 1  tab daily until bleeding stops 45 tablet 3   omeprazole (PRILOSEC) 40 MG capsule Take 40 mg by mouth every morning.     No current facility-administered medications for this visit.  Musculoskeletal: Strength & Muscle Tone: Unable to assess via virtual visit Gait & Station: Unable to assess via virtual visit Patient leans: N/A  Psychiatric Specialty Exam: Review of Systems  Constitutional:        No other complaints voiced at this time  Skin:        Facial body piercing bilaterally browl area, checks, upper lower lip, and nose that were visible.   Psychiatric/Behavioral:  Positive for agitation, decreased concentration, dysphoric mood, hallucinations (Reports sometime she hears her name called or people talking when no one is there.  Last 1-2 weeks ago) and sleep disturbance. Negative for suicidal ideas. Self-injury: A history of nonsuicidal self injurious behavior during teen years.  Reports hasn't cut since 23 y/o.The patient is nervous/anxious.   All other systems reviewed and are negative.   There were no vitals taken for this visit.There is no height or weight on file to calculate BMI.  General Appearance: Casual  Eye Contact:  Good  Speech:  Clear and Coherent and Normal Rate  Volume:  Normal  Mood:  Anxious and Depressed  Affect:  Congruent  Thought Process:  Coherent, Goal Directed, and Descriptions of Associations: Intact  Orientation:  Full (Time, Place, and Person)  Thought Content:  Logical  Suicidal Thoughts:  No  Homicidal Thoughts:  No  Memory:  Immediate;   Good Recent;   Good Remote;   Good  Judgement:  Intact  Insight:  Present  Psychomotor Activity:  Normal  Concentration:  Concentration: Good and Attention Span: Good  Recall:  Good  Fund of Knowledge:Good  Language: Good  Akathisia:  No  Handed:  Right  AIMS (if indicated):  done  Assets:  Communication Skills Desire for Improvement Housing Intimacy Leisure Time Social Support  ADL's:  Intact   Cognition: WNL  Sleep:  Fair   Screenings: Geneticist, molecular Office Visit from 12/09/2023 in [facility] [place]  AIMS Total Score 0   AUDIT    Flowsheet Row Office Visit from 12/09/2023 in [facility] [place]  Alcohol Use Disorder Identification Test Final Score (AUDIT) 4   GAD-7    Flowsheet Row Office Visit from 12/09/2023 in [facility] at [place] Office Visit from 01/27/2023 in [facility]  Total GAD-7 Score 11 0   PHQ2-9    Flowsheet Row Office Visit from 12/09/2023 in [facility] at [place] Office Visit from 01/27/2023 in [facility] at [facility]  PHQ-2 Total Score 0 0  PHQ-9 Total Score -- 0   Flowsheet Row Office Visit from 12/09/2023 in [facility] at [place]  C-SSRS RISK CATEGORY No Risk    Assessment and Plan:  Assessment: Patient seen and examined as noted above. Summary: Today Jamie Graham appears to be doing fairly well.  She reports worsening depression, anxiety, mood, sleep, and nightmares.  She denies suicidal/self-harm/homicidal ideation, and abnormal movements.  She endorses auditory hallucinations on/off but denies at this current time.  She also endorses paranoia/hypervigilance.       During visit she is dressed appropriate for age and weather.  She is seated comfortably in view of camera with no noted distress.  She is alert/oriented x 4, calm/cooperative and mood is congruent with affect.  She spoke in a clear tone at moderate volume, and normal pace, with good eye contact.  Her  thought process is coherent, relevant, and there is no indication that she is currently responding to internal/external stimuli or experiencing delusional thought content.    1. GAD (generalized anxiety disorder) (Primary) - FLUoxetine  HCl  60 MG TABS; Take 60 mg by mouth daily at 12 noon.  Dispense: 30 tablet; Refill: 0  2. Severe episode of recurrent major depressive disorder, with psychotic features (HCC) - ARIPiprazole  (ABILIFY ) 2 MG tablet; Take 1 tablet (2 mg total) by mouth daily.  Dispense: 30 tablet; Refill: 0 - FLUoxetine  HCl 60 MG TABS; Take 60 mg by mouth daily at 12 noon.  Dispense: 30 tablet; Refill: 0  3. Nightmares - prazosin  (MINIPRESS ) 1 MG capsule; Take 1 capsule (1 mg total) by mouth at bedtime.  Dispense: 30 capsule; Refill: 0  4. On psychotropic medication - TSH - Prolactin - Magnesium - HgB A1c - Lipid panel - hCG, serum, qualitative - CBC with Differential - Comprehensive metabolic panel with GFR  Plan: Medications: Meds ordered this encounter  Medications   ARIPiprazole  (ABILIFY ) 2 MG tablet    Sig: Take 1 tablet (2 mg total) by mouth daily.    Dispense:  30 tablet    Refill:  0    Supervising Provider:   ARFEEN, SYED T [[id number]]   FLUoxetine  HCl 60 MG TABS    Sig: Take 60 mg by mouth daily at 12 noon.    Dispense:  30 tablet    Refill:  0    Supervising Provider:   ARFEEN, SYED T [[id number]]   prazosin  (MINIPRESS ) 1 MG capsule    Sig: Take 1 capsule (1 mg total) by mouth at bedtime.    Dispense:  30 capsule    Refill:  0    Supervising Provider:   CURRY PATERSON T [[id number]]    Lab Orders         TSH         Prolactin         Magnesium         HgB A1c         Lipid panel         hCG, serum, qualitative         CBC with Differential         Comprehensive metabolic panel with GFR     Other:  Referred to  counseling/therapy.   Jamie Graham is instructed to call 911, [phone], mobile crisis, or present to the nearest emergency room should she experience any suicidal/homicidal ideation, auditory/visual/hallucinations, or detrimental worsening of her mental health condition.   Jamie Graham participated in the development of this treatment plan and verbalized her agreement with plan as  listed.  Follow Up: Return in 2 weeks for medication management Call in the interim for any side-effects, decompensation, questions, or problems  Collaboration of Care: Medication Management AEB medication assessment, adjustment, and prescriptions, started Prazosin  and Abilify , Referral or follow-up with counselor/therapist AEB referral to counseling/therapy, and Other labs ordered  Patient/Guardian was advised Release of Information must be obtained prior to any record release in order to collaborate their care with an outside provider. Patient/Guardian was advised if they have not already done so to contact the registration department to sign all necessary forms in order for us  to release information regarding their care.   Consent: Patient/Guardian gives verbal consent for treatment and assignment of benefits for services provided during this visit. Patient/Guardian expressed understanding and agreed to proceed.   Jamesetta Greenhalgh, NP 7/24/20254:03 PM

## 2023-12-09 NOTE — Patient Instructions (Signed)
 Types of Psychotherapy Interpersonal Psychotherapy (IPT) Focuses on treating depression related to significant loss, life changes, or interpersonal conflict. Improves interpersonal relationships and social skills. Recommended for mood disorders, anxiety, PTSD, bipolar disorder, eating disorders, postpartum depression, and borderline personality disorder. Cognitive Behavioral Therapy (CBT) Identifies and addresses negative thought patterns and beliefs. Goal-oriented and focuses on solving current challenges. Effective for addiction, depression, OCD, anxiety, chronic pain, bipolar disorder, anger management, personality disorders, eating disorders, marital conflict, and academic performance. Dialectical Behavioral Therapy (DBT) Balances acceptance and change by integrating opposing ideas. Helps identify and modify distressing behaviors. Initially developed for borderline personality disorder, but adapted for other conditions. Psychoanalytical and Psychodynamic Therapy Uncovers unconscious thoughts linked to childhood experiences. Long-term treatment performed by specially trained professionals. Used for chronic depression, anxiety disorders, somatic disorders, borderline personality disorder, PTSD, substance use disorders, and eating disorders. Humanistic Therapy Focuses on self-awareness and acceptance to reach full potential. Person-centered approach allowing clients to guide sessions. Recommended for trauma, depression, chronic conditions, anxiety, low self-esteem, relationship conflicts, personality disorders, addictions, and existential crises. Eclectic Therapy Combines techniques from different types of psychotherapy. Flexible and adapts to individual needs and goals. Can be short-term or long-term, often used for PTSD symptoms. Choosing the Right Therapy Consider the type of concern, duration, previous diagnosis, and whether you want to understand or change behaviors. Consultations  with multiple therapists can help determine the best approach. Check therapist credentials and training. Formats of Therapy Individual Therapy: One-on-one sessions. Group Therapy: Sessions with multiple participants sharing common goals. Couple's or Family Therapy: Sessions involving family members or partners. Effectiveness of Psychotherapy Rooted in science and evidence-based. Effective for a variety of mental health conditions and personal challenges. Requires active participation and collaboration. Online Therapy As effective as in-person therapy. Ensure privacy, verify therapist credentials, and maintain a conducive environment for sessions. This overview should give you a good understanding of the different psychotherapy options and how they can help.   Routine Lab Work: Labs are ordered since it's been a while since your last tests. Labs should be checked at least yearly, or more frequently depending on prescribed medications. Routine blood work helps assess overall health and rule out non-psychiatric conditions. Important tests include: Complete Blood Count (CBC) with Differential/Platelet Comprehensive Metabolic Panel Hemoglobin A1c (diabetes screening) Magnesium Ethanol Urinalysis (Routine with reflex microscopic, Clean Catch) Pregnancy test (urine, for females of childbearing age) POCT Urine Drug Screen (if prescribed any controlled substance) EKG Recommendations: An EKG is recommended before starting psychotropic medications and periodically thereafter to monitor for prolonged QTc, which can indicate cardiac risk factors. Some psychotropic medications can increase the risk of prolonged QTc. Have your primary care provider perform an EKG at your next visit and send the results if not available on Epic. Please ensure you have your labs drawn before your next scheduled visit.   Call 911, [phone], mobile crisis, or present to the nearest emergency room should you experience any  suicidal/homicidal ideation, auditory/visual/hallucinations, or detrimental worsening of your mental health.  Mobile Crisis Response Teams Listed by counties in vicinity of [facility] providers [facility] [phone] [facility] [phone] [facility] [phone] [facility] [phone] [place]                * [facility] [phone]                * [facility] [phone]  [facility] [phone] [facility] *  [facility]  [phone] * [facility] [phone]

## 2023-12-23 ENCOUNTER — Telehealth (HOSPITAL_COMMUNITY): Admitting: Registered Nurse

## 2023-12-23 ENCOUNTER — Encounter (HOSPITAL_COMMUNITY): Payer: Self-pay | Admitting: Registered Nurse

## 2023-12-23 DIAGNOSIS — F411 Generalized anxiety disorder: Secondary | ICD-10-CM | POA: Diagnosis not present

## 2023-12-23 DIAGNOSIS — F515 Nightmare disorder: Secondary | ICD-10-CM

## 2023-12-23 DIAGNOSIS — F333 Major depressive disorder, recurrent, severe with psychotic symptoms: Secondary | ICD-10-CM

## 2023-12-23 MED ORDER — FLUOXETINE HCL 60 MG PO TABS: 60.0000 mg | ORAL_TABLET | Freq: Every day | ORAL | 1 refills | Status: DC

## 2023-12-23 MED ORDER — PRAZOSIN HCL 2 MG PO CAPS: 2.0000 mg | ORAL_CAPSULE | Freq: Every day | ORAL | 1 refills | Status: DC

## 2023-12-23 MED ORDER — ARIPIPRAZOLE 2 MG PO TABS: 4.0000 mg | ORAL_TABLET | Freq: Every day | ORAL | 0 refills | Status: DC
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
## 2023-12-23 NOTE — Patient Instructions (Signed)

## 2023-12-30 ENCOUNTER — Ambulatory Visit (INDEPENDENT_AMBULATORY_CARE_PROVIDER_SITE_OTHER): Admitting: Psychiatry

## 2023-12-30 ENCOUNTER — Encounter (HOSPITAL_COMMUNITY): Payer: Self-pay | Admitting: Psychiatry

## 2023-12-30 DIAGNOSIS — F411 Generalized anxiety disorder: Secondary | ICD-10-CM
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
## 2024-01-06 ENCOUNTER — Other Ambulatory Visit: Payer: Self-pay | Admitting: Advanced Practice Midwife

## 2024-01-14 ENCOUNTER — Telehealth (HOSPITAL_COMMUNITY): Payer: Self-pay | Admitting: *Deleted

## 2024-01-14 ENCOUNTER — Other Ambulatory Visit (HOSPITAL_COMMUNITY)
Admission: RE | Admit: 2024-01-14 | Discharge: 2024-01-14 | Disposition: A | Discharge status: Home / Self Care | Source: Ambulatory Visit | Attending: Registered Nurse | Admitting: Registered Nurse

## 2024-01-14 DIAGNOSIS — Z79899 Other long term (current) drug therapy: Secondary | ICD-10-CM | POA: Insufficient documentation

## 2024-01-14 DIAGNOSIS — Z3202 Encounter for pregnancy test, result negative: Secondary | ICD-10-CM | POA: Insufficient documentation

## 2024-01-14 LAB — COMPREHENSIVE METABOLIC PANEL WITH GFR
ALT: 20 U/L (ref 0–44)
AST: 24 U/L (ref 15–41)
Albumin: 4 g/dL (ref 3.5–5.0)
Alkaline Phosphatase: 83 U/L (ref 38–126)
Anion gap: 13 (ref 5–15)
BUN: 10 mg/dL (ref 6–20)
CO2: 22 mmol/L (ref 22–32)
Calcium: 9.6 mg/dL (ref 8.9–10.3)
Chloride: 104 mmol/L (ref 98–111)
Creatinine, Ser: 0.71 mg/dL (ref 0.44–1.00)
GFR, Estimated: >60 mL/min (ref >60)
Glucose, Bld: 93 mg/dL (ref 70–99)
Potassium: 4.5 mmol/L (ref 3.5–5.1)
Sodium: 139 mmol/L (ref 135–145)
Total Bilirubin: 0.6 mg/dL (ref 0.0–1.2)
Total Protein: 7.8 g/dL (ref 6.5–8.1)

## 2024-01-14 LAB — LIPID PANEL
Cholesterol: 175 mg/dL (ref 0–200)
HDL: 72 mg/dL (ref >40)
LDL Cholesterol: 94 mg/dL (ref 0–99)
Total CHOL/HDL Ratio: 2.4 ratio
Triglycerides: 44 mg/dL (ref <150)
VLDL: 9 mg/dL (ref 0–40)

## 2024-01-14 LAB — CBC WITH DIFFERENTIAL/PLATELET
Abs Immature Granulocytes: 0.01 K/uL (ref 0.00–0.07)
Basophils Absolute: 0 K/uL (ref 0.0–0.1)
Basophils Relative: 1 %
Eosinophils Absolute: 0.1 K/uL (ref 0.0–0.5)
Eosinophils Relative: 2 %
HCT: 43.6 % (ref 36.0–46.0)
Hemoglobin: 14.9 g/dL (ref 12.0–15.0)
Immature Granulocytes: 0 %
Lymphocytes Relative: 37 %
Lymphs Abs: 2.2 K/uL (ref 0.7–4.0)
MCH: 30.5 pg (ref 26.0–34.0)
MCHC: 34.2 g/dL (ref 30.0–36.0)
MCV: 89.2 fL (ref 80.0–100.0)
Monocytes Absolute: 0.4 K/uL (ref 0.1–1.0)
Monocytes Relative: 7 %
Neutro Abs: 3.1 K/uL (ref 1.7–7.7)
Neutrophils Relative %: 53 %
Platelets: 266 K/uL (ref 150–400)
RBC: 4.89 MIL/uL (ref 3.87–5.11)
RDW: 12.9 % (ref 11.5–15.5)
WBC: 5.9 K/uL (ref 4.0–10.5)
nRBC: 0 % (ref 0.0–0.2)

## 2024-01-14 LAB — HCG, SERUM, QUALITATIVE: Preg, Serum: NEGATIVE

## 2024-01-14 LAB — MAGNESIUM: Magnesium: 2.1 mg/dL (ref 1.7–2.4)

## 2024-01-14 LAB — TSH: TSH: 1.281 u[IU]/mL (ref 0.350–4.500)

## 2024-01-14 NOTE — Telephone Encounter (Signed)
 Patient called stating that the SIG and the Quantity that provider sent to the pharmacy does not match. Per pt she is suppose to be taking 2 tablets daily. Per pt the Quantity is suppose to be 60 ands not 30. Patient would like her Ab[facility]  [facility] [place].

## 2024-01-15 LAB — PROLACTIN: Prolactin: 15.8 ng/mL (ref 4.8–33.4)

## 2024-01-15 LAB — HEMOGLOBIN A1C
Hgb A1c MFr Bld: 5.2 % (ref 4.8–5.6)
Mean Plasma Glucose: 103 mg/dL

## 2024-01-18 ENCOUNTER — Telehealth (HOSPITAL_COMMUNITY): Payer: Self-pay

## 2024-01-18 ENCOUNTER — Other Ambulatory Visit (HOSPITAL_COMMUNITY): Payer: Self-pay | Admitting: Registered Nurse

## 2024-01-18 DIAGNOSIS — F333 Major depressive disorder, recurrent, severe with psychotic symptoms: Secondary | ICD-10-CM

## 2024-01-18 MED ORDER — ARIPIPRAZOLE 2 MG PO TABS: 4.0000 mg | ORAL_TABLET | Freq: Every day | ORAL | 0 refills | Status: DC

## 2024-01-18 NOTE — Telephone Encounter (Signed)
 Medication - Call with patient to inform Shuvon Rankin, NP had sent in her requested new Abilify  2 mg, 2 a day order, #60 to [facility] [place].

## 2024-01-18 NOTE — Telephone Encounter (Signed)
 Medication refill - Telephone call with patient regarding her need for a new Abilify  2 mg, order. Patient is scheduled to take twice a day but was only given #30 with last refill so it now out and needs a new order for #60 tablets to be [facility]r [facility] [place].  Agreed to send request to Luisa Ruder, NP as patient is not schedule for next appointment until 01/27/24.

## 2024-01-27 ENCOUNTER — Encounter (HOSPITAL_COMMUNITY): Payer: Self-pay | Admitting: Registered Nurse

## 2024-01-27 ENCOUNTER — Telehealth (INDEPENDENT_AMBULATORY_CARE_PROVIDER_SITE_OTHER): Admitting: Registered Nurse

## 2024-01-27 DIAGNOSIS — F515 Nightmare disorder: Secondary | ICD-10-CM

## 2024-01-27 DIAGNOSIS — F333 Major depressive disorder, recurrent, severe with psychotic symptoms: Secondary | ICD-10-CM | POA: Diagnosis not present

## 2024-01-27 DIAGNOSIS — F411 Generalized anxiety disorder: Secondary | ICD-10-CM

## 2024-01-27 MED ORDER — ARIPIPRAZOLE 5 MG PO TABS: 5.0000 mg | ORAL_TABLET | Freq: Every day | ORAL | 1 refills | Status: DC

## 2024-01-27 MED ORDER — BUSPIRONE HCL 5 MG PO TABS: 5.0000 mg | ORAL_TABLET | Freq: Two times a day (BID) | ORAL | 1 refills | Status: DC

## 2024-01-27 MED ORDER — FLUOXETINE HCL 60 MG PO TABS: 60.0000 mg | ORAL_TABLET | Freq: Every day | ORAL | 1 refills | Status: DC

## 2024-01-27 MED ORDER — PRAZOSIN HCL 2 MG PO CAPS: 2.0000 mg | ORAL_CAPSULE | Freq: Every day | ORAL | 1 refills | Status: DC

## 2024-01-27 MED ORDER — ARIPIPRAZOLE 2 MG PO TABS: 4.0000 mg | ORAL_TABLET | Freq: Every day | ORAL | 1 refills | Status: DC

## 2024-01-27 NOTE — Patient Instructions (Signed)
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
## 2024-02-03 ENCOUNTER — Ambulatory Visit (INDEPENDENT_AMBULATORY_CARE_PROVIDER_SITE_OTHER): Admitting: Psychiatry

## 2024-02-03 DIAGNOSIS — F411 Generalized anxiety disorder: Secondary | ICD-10-CM | POA: Diagnosis not present

## 2024-02-03 DIAGNOSIS — F333 Major depressive disorder, recurrent, severe with psychotic symptoms: Secondary | ICD-10-CM
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
## 2024-02-18 ENCOUNTER — Ambulatory Visit (HOSPITAL_COMMUNITY): Admitting: Psychiatry

## 2024-03-03 ENCOUNTER — Ambulatory Visit (HOSPITAL_COMMUNITY): Admitting: Psychiatry

## 2024-03-17 ENCOUNTER — Ambulatory Visit (HOSPITAL_COMMUNITY): Admitting: Psychiatry

## 2024-03-20 ENCOUNTER — Encounter (HOSPITAL_COMMUNITY): Payer: Self-pay | Admitting: Registered Nurse

## 2024-03-20 ENCOUNTER — Telehealth (INDEPENDENT_AMBULATORY_CARE_PROVIDER_SITE_OTHER): Admitting: Registered Nurse

## 2024-03-20 DIAGNOSIS — F411 Generalized anxiety disorder: Secondary | ICD-10-CM

## 2024-03-20 DIAGNOSIS — F515 Nightmare disorder: Secondary | ICD-10-CM

## 2024-03-20 DIAGNOSIS — F333 Major depressive disorder, recurrent, severe with psychotic symptoms: Secondary | ICD-10-CM

## 2024-03-20 MED ORDER — BUSPIRONE HCL 7.5 MG PO TABS: 7.5000 mg | ORAL_TABLET | Freq: Two times a day (BID) | ORAL | 1 refills | Status: AC

## 2024-03-20 MED ORDER — PRAZOSIN HCL 2 MG PO CAPS: 2.0000 mg | ORAL_CAPSULE | Freq: Every day | ORAL | 1 refills | Status: AC

## 2024-03-20 MED ORDER — HYDROXYZINE HCL 10 MG PO TABS: ORAL_TABLET | ORAL | 1 refills | Status: AC

## 2024-03-20 MED ORDER — ARIPIPRAZOLE 5 MG PO TABS: 5.0000 mg | ORAL_TABLET | Freq: Every day | ORAL | 1 refills | Status: AC

## 2024-03-20 MED ORDER — FLUOXETINE HCL 60 MG PO TABS: 60.0000 mg | ORAL_TABLET | Freq: Every day | ORAL | 1 refills | Status: AC

## 2024-03-20 NOTE — Progress Notes (Signed)
 [facility] MD/PA/NP OP Progress Note  03/20/2024 11:06 AM Jamie Graham  MRN:  [id number]  Virtual Visit via Video Note  I connected with Jamie Graham on 03/20/24 at 11:00 AM EST by a video enabled telemedicine application and verified that I am speaking with the correct person using two identifiers.  Location: Patient: Home Provider: Home office   I discussed the limitations of evaluation and management by telemedicine and the availability of in person appointments. The patient expressed understanding and agreed to proceed.  I discussed the assessment and treatment plan with the patient. The patient was provided an opportunity to ask questions and all were answered. The patient agreed with the plan and demonstrated an understanding of the instructions.   The patient was advised to call back or seek an in-person evaluation if the symptoms worsen or if the condition fails to improve as anticipated.  I provided 30 minutes of non-face-to-face time during this encounter.   Luisa Ruder, NP   Chief Complaint:  Chief Complaint  Patient presents with   Follow-up    Medication management   HPI: Jamie Graham 23 y.o. female presents today for medication management follow up.  She is seen via virtual video visit by this provider, and chart reviewed on 03/20/24.  Her psychiatric history is significant for  major depression, general anxiety, bipolar disorder, nightmares, and sleep disturbance.  Her mental health is currently managed with Abilify  7 mg daily, Prozac  60 mg daily, BuSpar  5 mg twice daily, and Prazosin  2 mg daily at bedtime.  She reports current medication regimen is somewhat managing mental health without adverse reaction.  She reports she has noticed a change in mood some worsening of depression around 5 or 6 PM.  Reports she has been eating and sleeping without any difficulty.  Reports she has held off on her counseling/therapy related to increased anxiety when she comes in for  services.  She denies suicidal/self-harm/homicidal ideation, psychosis, paranoia, and abnormal movement.  Screenings completed during today's visit C-SSRS, AIMS, Nutrition, and Pain, see scores below.  Recommendation: Continue Prozac  60 mg daily, prazosin  2 mg daily at bedtime, Abilify  7 mg daily, increase BuSpar  7.5 mg twice daily, start Vistaril 10 mg-20 mg 3 times daily as needed She voices understanding/agreement with information/recommendations being given to her today.    Visit Diagnosis:    ICD-10-CM   1. Severe episode of recurrent major depressive disorder, with psychotic features (HCC)  F33.3 busPIRone  (BUSPAR ) 7.5 MG tablet    prazosin  (MINIPRESS ) 2 MG capsule    ARIPiprazole  (ABILIFY ) 5 MG tablet    FLUoxetine  HCl 60 MG TABS    2. GAD (generalized anxiety disorder)  F41.1 busPIRone  (BUSPAR ) 7.5 MG tablet    FLUoxetine  HCl 60 MG TABS    3. Nightmares  F51.5 prazosin  (MINIPRESS ) 2 MG capsule   Sleep disturbance      Past Psychiatric History:  Diagnosis: major depression, ADHD, anxiety, bipolar disorder Suicide attempt: Denies Non-suicidal self-injurious behavior:  Reports cutting during teenage years.  Last cut at 23 y/o Psychiatric hospitalization:  Denies Past trauma: Reports a traumatic childhood history But, that's all I'm going to say about that and no further elaboration.   Substance abuse:  Jamie Graham Previous Psychotropic Medications: Yes, Adderall, Amitriptyline , Clonidine, Prozac , Vraylar, Trazodone (caused worsening of nightmares), Abilify    Past Medical History:  Past Medical History:  Diagnosis Date   Depression    Environmental allergies    IBS (irritable bowel syndrome)     Past  Surgical History:  Procedure Laterality Date   WISDOM TOOTH EXTRACTION      Family Psychiatric History: See below in family history  Family History:  Family History  Problem Relation Age of Onset   Heart attack Paternal Grandfather    Breast cancer Paternal Grandmother     Diabetes Maternal Grandmother    Anxiety disorder Maternal Grandmother    Depression Maternal Grandmother    COPD Maternal Grandmother    Hypertension Maternal Grandfather    Hypertension Father    Cancer Father        skin   Depression Mother    Anxiety disorder Mother    ADD / ADHD Mother    Fibromyalgia Mother    Migraines Mother    ADD / ADHD Brother    Autism Brother    Depression Sister    Depression Maternal Aunt    Depression Maternal Aunt     Social History:  Social History   Socioeconomic History   Marital status: Single    Spouse name: Not on file   Number of children: Not on file   Years of education: Not on file   Highest education level: Not on file  Occupational History   Not on file  Tobacco Use   Smoking status: Every Day    Types: Cigarettes   Smokeless tobacco: Never  Vaping Use   Vaping status: Every Day  Substance and Sexual Activity   Alcohol use: Yes    Comment: 2- 3 x per month, mixed drink or a shot   Drug use: No   Sexual activity: Yes    Birth control/protection: Implant  Other Topics Concern   Not on file  Social History Narrative   Not on file   Social Drivers of Health   Financial Resource Strain: Low Risk  (01/27/2023)   Overall Financial Resource Strain (CARDIA)    Difficulty of Paying Living Expenses: Not hard at all  Food Insecurity: No Food Insecurity (01/27/2023)   Hunger Vital Sign    Worried About Running Out of Food in the Last Year: Never true    Ran Out of Food in the Last Year: Never true  Transportation Needs: No Transportation Needs (01/27/2023)   PRAPARE - Administrator, Civil Service (Medical): No    Lack of Transportation (Non-Medical): No  Physical Activity: Sufficiently Active (01/27/2023)   Exercise Vital Sign    Days of Exercise per Week: 5 days    Minutes of Exercise per Session: 100 min  Stress: No Stress Concern Present (01/27/2023)   Jamie Graham of Occupational Health - Occupational  Stress Questionnaire    Feeling of Stress : Not at all  Social Connections: Socially Isolated (01/27/2023)   Social Connection and Isolation Panel    Frequency of Communication with Friends and Family: Twice a week    Frequency of Social Gatherings with Friends and Family: Three times a week    Attends Religious Services: Never    Active Member of Clubs or Organizations: No    Attends Banker Meetings: Never    Marital Status: Never married    Allergies:  Allergies  Allergen Reactions   Bactrim [Sulfamethoxazole-Trimethoprim] Rash   Metabolic Disorder Labs:  Labs ordered on initial visit.   Lab Results  Component Value Date   HGBA1C 5.2 01/14/2024   MPG 103 01/14/2024   Lab Results  Component Value Date   PROLACTIN 15.8 01/14/2024   Lab Results  Component Value Date  CHOL 175 01/14/2024   TRIG 44 01/14/2024   HDL 72 01/14/2024   CHOLHDL 2.4 01/14/2024   VLDL 9 01/14/2024   LDLCALC 94 01/14/2024   Lab Results  Component Value Date   TSH 1.281 01/14/2024    Current Medications: Current Outpatient Medications  Medication Sig Dispense Refill   ARIPiprazole  (ABILIFY ) 5 MG tablet Take 1 tablet (5 mg total) by mouth daily. Take with Abilify  2 mg to total 7 mg daily 60 tablet 1   busPIRone  (BUSPAR ) 7.5 MG tablet Take 1 tablet (7.5 mg total) by mouth 2 (two) times daily. 60 tablet 1   Etonogestrel  (NEXPLANON  [place]) Inject into the skin as directed.     FLUoxetine  HCl 60 MG TABS Take 60 mg by mouth daily at 12 noon. 60 tablet 1   megestrol  (MEGACE ) 40 MG tablet TAKE 3 TABS/DAY X 5D, THEN 2 TABS/DAY X 5D, THEN 1 TAB DAILY UNTIL BLEEDING STOPS 45 tablet 3   omeprazole (PRILOSEC) 40 MG capsule Take 40 mg by mouth every morning.     prazosin  (MINIPRESS ) 2 MG capsule Take 1 capsule (2 mg total) by mouth at bedtime. 60 capsule 1   No current facility-administered medications for this visit.   Musculoskeletal: Strength & Muscle Tone: Unable to assess via virtual  visit Gait & Station: Unable to assess via virtual visit Patient leans: N/A  Psychiatric Specialty Exam: Review of Systems  Constitutional:        No other complaints voiced   Psychiatric/Behavioral:  Positive for dysphoric mood. Negative for hallucinations, self-injury and suicidal ideas. Agitation: mood and Iritability improving. Sleep disturbance: Improved.The patient is nervous/anxious.   All other systems reviewed and are negative.   There were no vitals taken for this visit.There is no height or weight on file to calculate BMI.  General Appearance: Casual  Eye Contact:  Good  Speech:  Clear and Coherent and Normal Rate  Volume:  Normal  Mood:  Anxious and Euthymic  Affect:  Appropriate and Congruent  Thought Process:  Coherent, Goal Directed, and Descriptions of Associations: Intact  Orientation:  Full (Time, Place, and Person)  Thought Content: Logical   Suicidal Thoughts:  No  Homicidal Thoughts:  No  Memory:  Immediate;   Good Recent;   Good Remote;   Good  Judgement:  Intact  Insight:  Present  Psychomotor Activity:  Normal  Concentration:  Concentration: Good and Attention Span: Good  Recall:  Good  Fund of Knowledge: Good  Language: Good  Akathisia:  No  Handed:  Right  AIMS (if indicated): not done  Assets:  Communication Skills Desire for Improvement Financial Resources/Insurance Housing Intimacy Leisure Time Physical Health Resilience Social Support Transportation  ADL's:  Intact  Cognition: WNL  Sleep:  Good   Screenings: AIMS    Flowsheet Row Video Visit from 03/20/2024 in [facility] [place] Office Visit from 12/09/2023 in [facility] [place]  AIMS Total Score 0 0   AUDIT    Flowsheet Row Office Visit from 12/09/2023 in [facility]  Alcohol Use Disorder Identification Test Final Score (AUDIT) 4   GAD-7    Flowsheet Row  Counselor from 02/03/2024 in [facility] [place] Video Visit from 01/27/2024 in [facility] at [place] Counselor from 12/30/2023 in [facility] at [place] Office Visit from 12/09/2023 in [facility] at [place] [facility] Visit from  01/27/2023 in [facility]  Total GAD-7 Score 7 7 20 11  0   PHQ2-9    Flowsheet Row Counselor from 02/03/2024 in [facility] at [place] Video Visit from 01/27/2024 in [facility] at [place] Counselor from 12/30/2023 in [facility] at [place] Office Visit from 12/09/2023 in [facility] at [place] Office Visit from 01/27/2023 in [facility]  PHQ-2 Total Score 1 1 2  0 0  PHQ-9 Total Score 5 4 14  -- 0   Flowsheet Row Video Visit from 03/20/2024 in [facility] at [place] Video Visit from 01/27/2024 in [facility] at [place] Counselor from 12/30/2023 in [facility] at [place]  C-SSRS RISK CATEGORY No Risk No Risk No Risk    Assessment and Plan:  Assessment: Summary: Today Jamie Graham reported that current medications are somewhat managing her mental health without any adverse reaction.  Reports some worsening of depression starting in the evening around 5 or 6 PM.  Reports she is eating and sleeping without any difficulty.  Denies suicidal/self-harm/homicidal ideation, psychosis, paranoia, and abnormal movement.  Medication management discussed and adjustments may During visit she is dressed appropriate for age and weather.  She is seated comfortably in view of camera with no noted distress.  She is alert/oriented x 4, calm/cooperative and mood is  congruent with affect.  She spoke in a clear tone at moderate volume, and normal pace, with good eye contact.  Her thought process is coherent, relevant, and there is no indication that she is currently responding to internal/external stimuli or experiencing delusional thought content.    1. Severe episode of recurrent major depressive disorder, with psychotic features (HCC) (Primary) - busPIRone  (BUSPAR ) 7.5 MG tablet; Take 1 tablet (7.5 mg total) by mouth 2 (two) times daily.  Dispense: 60 tablet; Refill: 1 - prazosin  (MINIPRESS ) 2 MG capsule; Take 1 capsule (2 mg total) by mouth at bedtime.  Dispense: 60 capsule; Refill: 1 - ARIPiprazole  (ABILIFY ) 5 MG tablet; Take 1 tablet (5 mg total) by mouth daily. Take with Abilify  2 mg to total 7 mg daily  Dispense: 60 tablet; Refill: 1 - FLUoxetine  HCl 60 MG TABS; Take 60 mg by mouth daily at 12 noon.  Dispense: 60 tablet; Refill: 1  2. GAD (generalized anxiety disorder) - busPIRone  (BUSPAR ) 7.5 MG tablet; Take 1 tablet (7.5 mg total) by mouth 2 (two) times daily.  Dispense: 60 tablet; Refill: 1 - FLUoxetine  HCl 60 MG TABS; Take 60 mg by mouth daily at 12 noon.  Dispense: 60 tablet; Refill: 1  3. Nightmares Comments: Sleep disturbance - prazosin  (MINIPRESS ) 2 MG capsule; Take 1 capsule (2 mg total) by mouth at bedtime.  Dispense: 60 capsule; Refill: 1  Plan: Medications: Meds ordered this encounter  Medications   busPIRone  (BUSPAR ) 7.5 MG tablet    Sig: Take 1 tablet (7.5 mg total) by mouth 2 (two) times daily.    Dispense:  60 tablet    Refill:  1    Supervising Provider:   ARFEEN, SYED T [[id number]]   prazosin  (MINIPRESS ) 2 MG capsule    Sig: Take 1 capsule (2 mg total) by mouth at bedtime.    Dispense:  60 capsule    Refill:  1    Supervising Provider:   CURRY PATERSON T [[id number]]   ARIPiprazole  (ABILIFY )  5 MG tablet    Sig: Take 1 tablet (5 mg total) by mouth daily. Take with Abilify  2 mg to total 7 mg daily    Dispense:  60 tablet    Refill:  1     Supervising Provider:   ARFEEN, SYED T [[id number]]   FLUoxetine  HCl 60 MG TABS    Sig: Take 60 mg by mouth daily at 12 noon.    Dispense:  60 tablet    Refill:  1    Supervising Provider:   CURRY LENI DASEN [[id number]]    Labs: Most recent labs reviewed 01/14/2024. Other:  Continue services with Winton Rubinstein, LCSW.     ARDYN FORGE is instructed to call [phone], mobile crisis, or present to the nearest emergency room should she experience any suicidal/homicidal ideation, auditory/visual/hallucinations, or detrimental worsening of her mental health condition.   Jamie Graham participated in the development of this treatment plan and verbalized her understanding/agreement with plan as listed.  Follow Up: Return in 2 month for medication management Call in the interim for any side-effects, decompensation, questions, or problems  Collaboration of Care: Collaboration of Care: Medication Management AEB medication assessment, adjustment, refills  Patient/Guardian was advised Release of Information must be obtained prior to any record release in order to collaborate their care with an outside provider. Patient/Guardian was advised if they have not already done so to contact the registration department to sign all necessary forms in order for us  to release information regarding their care.   Consent: Patient/Guardian gives verbal consent for treatment and assignment of benefits for services provided during this visit. Patient/Guardian expressed understanding and agreed to proceed.    Dorsey Authement, NP 03/20/2024, 11:06 AM

## 2024-03-20 NOTE — Patient Instructions (Signed)

## 2024-05-25 ENCOUNTER — Other Ambulatory Visit: Payer: Self-pay | Admitting: Advanced Practice Midwife
